# Patient Record
Sex: Male | Born: 1975 | Race: White | Hispanic: No | Marital: Single | Smoking: Never smoker
Health system: Southern US, Community
[De-identification: ages and names within clinical notes are randomized; demographics above are authoritative.]

---

## 2014-09-20 LAB — COMPREHENSIVE METABOLIC PANEL
ALT: 24 U/L
AST: 23 U/L
Albumin: 4.3 g/dL
Alkaline Phosphatase: 102 U/L
Anion Gap: 9 (ref 7–16)
BILIRUBIN TOTAL: 0.9 mg/dL
BUN: 12 mg/dL
CALCIUM: 9.3 mg/dL
CO2: 23 mmol/L
CREATININE: 1.04 mg/dL
Chloride: 107 mmol/L
EGFR (African American): >60
GLUCOSE: 133 mg/dL — AB
Potassium: 3.5 mmol/L
SODIUM: 139 mmol/L
Total Protein: 7.7 g/dL

## 2014-09-20 LAB — CBC
HCT: 44.2 % (ref 40.0–52.0)
HGB: 15.1 g/dL (ref 13.0–18.0)
MCH: 30.7 pg (ref 26.0–34.0)
MCHC: 34.2 g/dL (ref 32.0–36.0)
MCV: 90 fL (ref 80–100)
Platelet: 280 10*3/uL (ref 150–440)
RBC: 4.93 10*6/uL (ref 4.40–5.90)
RDW: 12.5 % (ref 11.5–14.5)
WBC: 11.2 10*3/uL — ABNORMAL HIGH (ref 3.8–10.6)

## 2014-09-20 LAB — ACETAMINOPHEN LEVEL

## 2014-09-20 LAB — SALICYLATE LEVEL

## 2014-09-20 LAB — ETHANOL: Ethanol: <5 mg/dL

## 2014-09-21 ENCOUNTER — Inpatient Hospital Stay
Admit: 2014-09-21 | Disposition: A | Discharge status: Home / Self Care | Payer: Self-pay | Attending: Psychiatry | Admitting: Psychiatry

## 2014-09-21 LAB — URINALYSIS, COMPLETE
BACTERIA: NONE SEEN
Bilirubin,UR: NEGATIVE
Blood: NEGATIVE
Glucose,UR: NEGATIVE mg/dL (ref 0–75)
Ketone: NEGATIVE
Leukocyte Esterase: NEGATIVE
NITRITE: NEGATIVE
Ph: 5 (ref 4.5–8.0)
Protein: NEGATIVE
SPECIFIC GRAVITY: 1.024 (ref 1.003–1.030)
Squamous Epithelial: NONE SEEN

## 2014-09-21 LAB — DRUG SCREEN, URINE
AMPHETAMINES, UR SCREEN: NEGATIVE
Barbiturates, Ur Screen: NEGATIVE
Benzodiazepine, Ur Scrn: NEGATIVE
COCAINE METABOLITE, UR ~~LOC~~: NEGATIVE
Cannabinoid 50 Ng, Ur ~~LOC~~: POSITIVE
MDMA (Ecstasy)Ur Screen: NEGATIVE
Methadone, Ur Screen: NEGATIVE
OPIATE, UR SCREEN: NEGATIVE
PHENCYCLIDINE (PCP) UR S: NEGATIVE
Tricyclic, Ur Screen: NEGATIVE

## 2014-09-22 LAB — TSH: THYROID STIMULATING HORM: 0.79 u[IU]/mL

## 2014-09-23 LAB — HEMOGLOBIN A1C: Hemoglobin A1C: 4.9 %

## 2014-10-11 NOTE — Consult Note (Signed)
Chief Complaint:  Chief Complaint Pt is a 39 yo obese male seen in ED BHU for follow up.   Presenting Symptoms:  Presenting Symptoms Anxiety/Panic  Mood Disturbance   History of Present Illness:  History of Present Illness He stated that he came here due to depression, mood swings and suicidal ideations. he was unable to keep his job and he was in therpay but it was not not effective. He was started on Celexa which made him worse and he became more agitated and angry. [facility] and they started him on Lamotrigine last night. He denied perceptual disturbances. has been using MJ occasioanlly to control his symptoms.   Target Symptoms:  Depressive Interest Loss  Appetite Change  Sleep Change  Anhedonia  Suicidality  Depressed Mood   Manic Impaired Judgment  Impulsivity   Arousal/Cognitive Decreased Concentration   Past Psychiatric Treatment: Current Psychotropic Medications: Celexa.  Mental Status Exam:  Mood Depressed   Affect Depressed   Thought Processes Linear/logical/goal directed   Orientation Place  Time  Person   Attention Alert   Concentration Fair   Memory Intact   Fund of Knowledge Good   Language Good   Judgement Good   Insight Good   Reliabiity Good   Review of Systems:  Review of Systems:  Medications/Allergies Reviewed Medications/Allergies reviewed   NURSING FLOWSHEETS:  Vital Signs/Nurse Notes-CM: ED Vital Sign Flow Sheet:   11-Apr-16 08:14  Temp Temperature 98.2  Temp Source oral  Pulse Pulse 83  Respirations Respirations 18  SBP SBP 134  DBP DBP 77  Pulse Ox % Pulse Ox % 98  Pulse Ox Source Source Room Air  Pain Scale (0-10) Pain Scale (0-10) Scale:0   Assessment & Diagnosis: Axis I: Bipolar Disorder.   Axis II: none.   Axis III: Obesity.  Treatment Plan: Patient is aware of and understands the risks and benefits of the proposed treatment or treatment changes..   Pt will be admitted to Inpt BHU Continue  Lamotrigine 25mg  po qdaily Treatment team to follow.  Electronic Signatures: Rhunette CroftFaheem, Xcaret Morad S (MD)  (Signed 11-Apr-16 12:13)  Authored: Chief Complaint, Presenting Symptoms, History of Present Illness, Target Symptoms, Past Psychiatric Treatment, Mental Status Exam, Review of Systems, NURSING FLOWSHEETS, Assessment & Diagnosis, Treatment Plan   Last Updated: 11-Apr-16 12:13 by Rhunette CroftFaheem, Konor Noren S (MD)

## 2014-10-11 NOTE — H&P (Signed)
PATIENT NAME:  Mason Craig, Mason Craig MR#:  [id number] DATE OF BIRTH:  07-15-1975  DATE OF ADMISSION:  09/21/2014  IDENTIFYING INFORMATION: The patient is a 39 year old obese single Caucasian male, currently unemployed, from [place], [place].   CHIEF COMPLAINT:  Decline in functioning and thoughts of self-harm.   HISTORY OF PRESENT ILLNESS: This patient presented to our Emergency Department on April 11 reporting having thoughts of wanting to hurt himself and worsening of his depression. The patient had a long history of depression that started in his adolescence, his depression worsened after his mother passed away unexpectedly 5 years ago. However the patient had not attended treatment for many years due to a bad experience with a therapist back in his adolescence. Just recently he started attending [facility] where he was prescribed with citalopram, the patient has been taking it daily for 30 days, but he reports lack of benefit. The patient said that for the last couple of weeks the depression has significantly worsened to the point where he was unable to attend work, he missed several days, and was eventually fired. The patient reports isolation, guilt, insomnia, poor appetite, poor energy, poor concentration, and episodes of agitation. The patient explains that he has been told by family members he has been agitated and at times displays threatening behavior. However he says "I don't realize doing it." Today the patient denies any thoughts of suicidality, homicidality, or auditory or visual hallucinations, however he does report thoughts of wanting to cut and the feeling that he was spiraling out of control. As far as bipolar disorder the patient having episodes that do not last more than 2 days when he has high energy, however this episode does not seem to meet full criteria for manic or hypomanic episode. Trauma, the patient denies any history of traumatic events. 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  Currently he uses marijuana once a month and he only smokes on special occasions. His last drink was in [place]. He drank heavily in his 64s, but once again now is a rare event. He does not smoke cigarettes.   PAST PSYCHIATRIC HISTORY: The patient denies any prior psychiatric hospitalizations or suicidal attempts. He does have history of self injury which was during his adolescent, his last episode of self injury was in Thanksgiving of last year. Currently on Celexa prescribed by Dr. Jacqualine Code from [facility] where he has been receiving treatment for the last 30 days. As an adolescent the patient was seen in therapy, however he felt betrayed as his therapist told all the information he had disclosed to his parents.   PAST MEDICAL HISTORY: The patient suffers from chronic tendinitis on his ankles and take ibuprofen as needed. 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 Denies any history of seizures or head trauma.   FAMILY HISTORY: The patient reported his father and sister suffer from depression and have been treated with antidepressants, but here is no history of suicide. He grandfather suffered from alcoholism.   SOCIAL HISTORY: The patient is currently living with his father. His mother passed away unexpectedly 5 years ago. The patient is single, never married, does not have any children. 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 As far as legal history, he denies any legal issues in the past or present. His education is 1-1/2 years of college.  ALLERGIES: HE IS ALLERGIC TO ONE NONSTEROIDAL ANTI-INFLAMMATORY DRUG, BUT DOES NOT REMEMBER THE NAME.   REVIEW OF SYSTEMS: The patient denies nausea, vomiting, or diarrhea. The rest of the review of systems is negative.   MENTAL STATUS  EXAMINATION: The patient is a 39 year old obese Caucasian male who appears his stated age. Appearance, obese, long hair, has a beard and black ink tattoos and piercing on his chin. Psychomotor activity mildly decreased. Eye contact within normal range. Speech had regular tone, volume, and rate. Thought process is linear and goal directed. Thought content negative for suicidality or homicidality. Perception negative for psychosis. Mood dysphoric. Affect congruent,  blunted.  Insight and judgment fair. On cognitive examination he is alert and oriented in person, place, time, and situation. Insight and judgment fair.    PHYSICAL EXAMINATION:  GENERAL: The patient is a 39 year old obese Caucasian male who appears his stated age.  VITAL SIGNS:  His height is 75 inches, weight is 370 pounds, BMI is 46.3. Blood pressure 142/86, respirations 20, pulse 66, temperature 98.  MUSCULOSKELETAL: Normal gait, normal muscular tone. No evidence of involuntary movements.   Physical examination was completed in our Emergency Department and was within normal limits.   LABORATORY RESULTS: BUN 12, creatinine 1.04, sodium 139, potassium 3.5, calcium 9.3. Alcohol was below detection limit. AST 23, ALT 24. Urine toxicology is positive for cannabis. WBC 12.2, hemoglobin 15.1, hematocrit 44.2, platelet count 280,000. UA is clear, clean. Acetaminophen level less than 5and  Salicylate level less than 4.   ASSESSMENT:  A 39 year old Caucasian male with history of depression who has worsened over the last several weeks and has failed to respond to treatment with citalopram. The patient felt inability to control his emotions and started having thoughts of self-harm, therefore was admitted in our facility for further stabilization.   DIAGNOSES:  1.  Major depressive disorder, recurrent, moderate.  2.  Marijuana use disorder, mild.  3.  Obesity.  4.  Chronic tendinitis.   5.  Pseudopapilledema.    PLAN:   1.  For major depressive  disorder the patient will be started on fluoxetine 20 mg p.o. daily.  2.  For insomnia I will restart the patient on trazodone 100 mg p.o. at bedtime.  3.  Laboratories, I will order a TSH and a B12 level in order to rule out organic causes for depression. In the Emergency Department the patient was evaluated by telemedicine, per their interview they felt that the patient met criteria for bipolar disorder and he was started on olanzapine and Lamictal, however based on my interview today the patient does not seem to meet criteria for bipolar disorder and I will discontinue those medications.  4.  Hospitalization status: The patient will be continued on voluntary status.  5.  Discharge disposition.  Once stable the patient will be discharged back to home and he will be scheduled to follow up with [facility].    ____________________________ Hildred Priest, MD ahg:bu D: 09/22/2014 17:35:09 ET T: 09/22/2014 18:06:47 ET JOB#: [id number]  cc: Hildred Priest, MD, <Dictator> Rhodia Albright MD ELECTRONICALLY SIGNED 09/23/2014 19:03

## 2019-09-01 ENCOUNTER — Emergency Department: Payer: BC Managed Care – PPO

## 2019-09-01 ENCOUNTER — Other Ambulatory Visit: Payer: Self-pay

## 2019-09-01 ENCOUNTER — Encounter: Payer: Self-pay | Admitting: Emergency Medicine

## 2019-09-01 ENCOUNTER — Emergency Department
Admission: EM | Admit: 2019-09-01 | Discharge: 2019-09-01 | Disposition: A | Discharge status: Home / Self Care | Payer: BC Managed Care – PPO | Attending: Emergency Medicine | Admitting: Emergency Medicine

## 2019-09-01 DIAGNOSIS — M10071 Idiopathic gout, right ankle and foot: Secondary | ICD-10-CM | POA: Diagnosis not present

## 2019-09-01 DIAGNOSIS — M79671 Pain in right foot: Secondary | ICD-10-CM | POA: Diagnosis present

## 2019-09-01 DIAGNOSIS — B070 Plantar wart: Secondary | ICD-10-CM | POA: Diagnosis not present

## 2019-09-01 LAB — BASIC METABOLIC PANEL
Anion gap: 8 (ref 5–15)
BUN: 13 mg/dL (ref 6–20)
CO2: 24 mmol/L (ref 22–32)
Calcium: 8.9 mg/dL (ref 8.9–10.3)
Chloride: 104 mmol/L (ref 98–111)
Creatinine, Ser: 0.78 mg/dL (ref 0.61–1.24)
GFR calc Af Amer: >60 mL/min (ref >60)
GFR calc non Af Amer: >60 mL/min (ref >60)
Glucose, Bld: 114 mg/dL — ABNORMAL HIGH (ref 70–99)
Potassium: 4.1 mmol/L (ref 3.5–5.1)
Sodium: 136 mmol/L (ref 135–145)

## 2019-09-01 LAB — CBC WITH DIFFERENTIAL/PLATELET
Abs Immature Granulocytes: 0.03 10*3/uL (ref 0.00–0.07)
Basophils Absolute: 0 10*3/uL (ref 0.0–0.1)
Basophils Relative: 0 %
Eosinophils Absolute: 0.2 10*3/uL (ref 0.0–0.5)
Eosinophils Relative: 3 %
HCT: 41 % (ref 39.0–52.0)
Hemoglobin: 13.9 g/dL (ref 13.0–17.0)
Immature Granulocytes: 0 %
Lymphocytes Relative: 19 %
Lymphs Abs: 1.4 10*3/uL (ref 0.7–4.0)
MCH: 31 pg (ref 26.0–34.0)
MCHC: 33.9 g/dL (ref 30.0–36.0)
MCV: 91.3 fL (ref 80.0–100.0)
Monocytes Absolute: 0.6 10*3/uL (ref 0.1–1.0)
Monocytes Relative: 8 %
Neutro Abs: 5 10*3/uL (ref 1.7–7.7)
Neutrophils Relative %: 70 %
Platelets: 299 10*3/uL (ref 150–400)
RBC: 4.49 MIL/uL (ref 4.22–5.81)
RDW: 11.5 % (ref 11.5–15.5)
WBC: 7.2 10*3/uL (ref 4.0–10.5)
nRBC: 0 % (ref 0.0–0.2)

## 2019-09-01 LAB — URIC ACID: Uric Acid, Serum: 7.9 mg/dL (ref 3.7–8.6)

## 2019-09-01 LAB — SEDIMENTATION RATE: Sed Rate: 46 mm/hr — ABNORMAL HIGH (ref 0–15)

## 2019-09-01 MED ORDER — TRAMADOL HCL 50 MG PO TABS
50.0000 mg | ORAL_TABLET | Freq: Once | ORAL | Status: AC
  Administered 2019-09-01: 50 mg via ORAL
  Filled 2019-09-01: qty 1

## 2019-09-01 MED ORDER — COLCHICINE 0.6 MG PO TABS
1.2000 mg | ORAL_TABLET | Freq: Once | ORAL | Status: AC
  Administered 2019-09-01: 1.2 mg via ORAL
  Filled 2019-09-01: qty 2

## 2019-09-01 MED ORDER — COLCHICINE 0.6 MG PO TABS: 0.6000 mg | ORAL_TABLET | Freq: Every day | ORAL | 2 refills | Status: AC

## 2019-09-01 MED ORDER — OXYCODONE-ACETAMINOPHEN 7.5-325 MG PO TABS: 1.0000 | ORAL_TABLET | Freq: Four times a day (QID) | ORAL | 0 refills | Status: AC | PRN

## 2019-09-01 MED ORDER — KETOROLAC TROMETHAMINE 30 MG/ML IJ SOLN
30.0000 mg | Freq: Once | INTRAMUSCULAR | Status: AC
  Administered 2019-09-01: 30 mg via INTRAVENOUS
  Filled 2019-09-01: qty 1

## 2019-09-01 MED ORDER — INDOMETHACIN 50 MG PO CAPS
50.0000 mg | ORAL_CAPSULE | Freq: Three times a day (TID) | ORAL | 1 refills | Status: AC | PRN
Start: 2019-09-01 — End: ?

## 2019-09-01 NOTE — ED Provider Notes (Signed)
[facility] Emergency Department Provider Note   ____________________________________________   First MD Initiated Contact with Patient 09/01/19 1145     (approximate)  [name].   HISTORY  Chief Complaint Foot Pain    HPI Mason Craig is a 44 y.o. male patient presents with right foot pain for few days.  Patient states no provocative incident for complaint.  Patient described the pain as "throbbing".  Patient unable to bear weight secondary to discomfort.  Rates the pain as 4/10.  No palliative measure for complaint.  Patient is morbid obese.         History reviewed. No pertinent past medical history.  [name]   History reviewed. No pertinent surgical history.  Prior to Admission medications   Medication Sig Start Date End Date Taking? Authorizing Provider  colchicine 0.6 MG tablet Take 1 tablet (0.6 mg total) by mouth daily. 09/01/19 08/31/20  Joni Reining, PA-C  indomethacin (INDOCIN) 50 MG capsule Take 1 capsule (50 mg total) by mouth 3 (three) times daily as needed. 09/01/19   Joni Reining, PA-C  oxyCODONE-acetaminophen (PERCOCET) 7.5-325 MG tablet Take 1 tablet by mouth every 6 (six) hours as needed. 09/01/19   Joni Reining, PA-C    Allergies Patient has no known allergies.  No family history on file.  Social History Social History   Tobacco Use  . Smoking status: Never Smoker  . Smokeless tobacco: Never Used  Substance Use Topics  . Alcohol use: Not Currently  . Drug use: Not on file    Review of Systems Constitutional: No fever/chills Eyes: No visual changes. ENT: No sore throat. Cardiovascular: Denies chest pain. Respiratory: Denies shortness of breath. Gastrointestinal: No abdominal pain.  No nausea, no vomiting.  No diarrhea.  No constipation. Genitourinary: Negative for dysuria. Musculoskeletal: Right foot pain.  Skin: Negative for rash. Neurological: Negative for headaches, focal weakness or numbness.   ____________________________________________   PHYSICAL EXAM:  VITAL SIGNS: [name]  [name]     BP 09/01/19 1127 140/89     Pulse Rate 09/01/19 1127 75     Resp 09/01/19 1127 18     Temp 09/01/19 1127 97.7 F (36.5 C)     Temp Source 09/01/19 1127 Oral     SpO2 09/01/19 1127 99 %     Weight 09/01/19 1131 (!) 449 lb 11.8 oz (204 kg)     Height 09/01/19 1127 6\' 3"  (1.905 m)     Head Circumference --      Peak Flow --      Pain Score 09/01/19 1127 4     Pain Loc --      Pain Edu? --      Excl. in GC? --     Constitutional: Alert and oriented. Well appearing and in no acute distress.  Morbid obesity. Cardiovascular: Normal rate, regular rhythm. Grossly normal heart sounds.  Good peripheral circulation. Respiratory: Normal respiratory effort.  No retractions. Lungs CTAB. Musculoskeletal: Pes planus obvious edema but no erythema..  Neurologic:  Normal speech and language. No gross focal neurologic deficits are appreciated. No gait instability. Skin:  Skin is warm, dry and intact. No rash noted.  Multiple calluses secondary to plantar warts. Psychiatric: Mood and affect are normal. Speech and behavior are normal.  ____________________________________________   LABS (all labs ordered are listed, but only abnormal results are displayed)  Labs Reviewed  BASIC METABOLIC PANEL - Abnormal; Notable for the following components:      Result Value   Glucose, Bld 114 (*)    All other components within normal limits  SEDIMENTATION RATE - Abnormal; Notable for the following components:   Sed Rate 46 (*)    All other components within normal limits  CBC WITH DIFFERENTIAL/PLATELET  URIC ACID   ____________________________________________  EKG   ____________________________________________  RADIOLOGY  [name]:    Official radiology report(s): DG Foot  Complete Right  Result Date: 09/01/2019 CLINICAL DATA:  Foot pain and swelling, no known injury, initial encounter EXAM: RIGHT FOOT COMPLETE - 3+ VIEW COMPARISON:  None. FINDINGS: Generalized soft tissue swelling is noted. No radiopaque foreign body is seen. No acute fracture is noted. Tarsal degenerative changes are seen. No other focal abnormality is noted. IMPRESSION: Soft tissue swelling without acute bony abnormality. Electronically Signed   By: Inez Catalina M.D.   On: 09/01/2019 12:22    ____________________________________________   PROCEDURES  Procedure(s) performed (including Critical Care):  Procedures   ____________________________________________   [name]  [name], [name]   Patient presents with nontraumatic foot pain and edema.  Gust lab results with patient consistent with inflammatory process.  Patient physical exam is consistent with gout.  Patient given discharge care instruction advised take medication as directed.  Patient advised to follow-up with podiatry due to multiple plantar warts with heavy calluses.       Mason Craig was evaluated in Emergency Department on 09/01/2019 for the symptoms described in the history of present illness. He was evaluated in the context of the global COVID-19 pandemic, which necessitated consideration that the patient might be at risk for infection with the SARS-CoV-2 virus that causes COVID-19. Institutional protocols and algorithms that pertain to the evaluation of patients at risk for COVID-19 are in a state of rapid change based on information released by regulatory bodies including the CDC and federal and state organizations. These policies and algorithms were followed during the patient's care in the ED.       ____________________________________________   FINAL CLINICAL IMPRESSION(S) /  ED DIAGNOSES  Final diagnoses:  Acute idiopathic gout of right foot  Plantar warts     ED Discharge Orders         Ordered    colchicine 0.6 MG tablet  Daily     09/01/19 1345    indomethacin (INDOCIN) 50 MG capsule  3 times daily PRN     09/01/19 1345    oxyCODONE-acetaminophen (PERCOCET) 7.5-325 MG tablet  Every 6 hours PRN     09/01/19 1345           Note:  This document was prepared using Dragon voice recognition software and may include unintentional dictation errors.    Sable Feil, PA-C 09/01/19 1349    Blake Divine, MD 09/02/19 2023

## 2019-09-01 NOTE — ED Triage Notes (Signed)
Pt here with c/o right foot pain that began a few days ago, "throbbing" while in triage, denies known injury to foot, is able to put minimal pressure on it. Nad.

## 2019-09-01 NOTE — ED Notes (Signed)
Gave walker to pt and wheeled out to parking lot for ride.

## 2019-09-01 NOTE — Discharge Instructions (Signed)
Follow discharge care instruction take medication as directed.  Ambulate with crutches for 3 to 5 days as needed.  Advised to follow-up podiatry due to your foot callus and plantar warts.

## 2019-11-09 ENCOUNTER — Other Ambulatory Visit: Payer: Self-pay

## 2019-11-09 ENCOUNTER — Emergency Department
Admission: EM | Admit: 2019-11-09 | Discharge: 2019-11-09 | Disposition: A | Discharge status: Home / Self Care | Payer: Self-pay | Attending: Emergency Medicine | Admitting: Emergency Medicine

## 2019-11-09 ENCOUNTER — Emergency Department: Payer: Self-pay

## 2019-11-09 ENCOUNTER — Encounter: Payer: Self-pay | Admitting: Emergency Medicine

## 2019-11-09 DIAGNOSIS — M25522 Pain in left elbow: Secondary | ICD-10-CM | POA: Insufficient documentation

## 2019-11-09 DIAGNOSIS — Z79899 Other long term (current) drug therapy: Secondary | ICD-10-CM | POA: Insufficient documentation

## 2019-11-09 MED ORDER — OXYCODONE-ACETAMINOPHEN 5-325 MG PO TABS
1.0000 | ORAL_TABLET | Freq: Once | ORAL | Status: AC
  Administered 2019-11-09: 1 via ORAL
  Filled 2019-11-09: qty 1

## 2019-11-09 MED ORDER — TRAMADOL HCL 50 MG PO TABS: 50.0000 mg | ORAL_TABLET | Freq: Four times a day (QID) | ORAL | 0 refills | Status: AC | PRN

## 2019-11-09 MED ORDER — PREDNISONE 50 MG PO TABS: ORAL_TABLET | ORAL | 0 refills | Status: AC

## 2019-11-09 NOTE — ED Triage Notes (Signed)
Pt arrived via POV from home with c/o left elbow pain worse since yesterday. Denies any injury, c/o pain to the touch and states when he rotates his wrist the pain is worse.  Pt states last night he took tylenol and ibuprofen with no relief.

## 2019-11-09 NOTE — ED Provider Notes (Signed)
[facility] Emergency Department Provider Note  ____________________________________________  Time seen: Approximately 1:04 PM  [name].   HISTORY  Chief Complaint Elbow Pain    HPI Mason Craig is a 44 y.o. male that presents to the emergency department for evaluation of left elbow pain for 2 days.  Patient states the pain is to the back of his elbow.  He is able to bend his elbow but with pain.  He does recall hitting his elbow on something several days ago but did not immediately have pain.  He denies any pain to the outside or inside of his elbow.  He does have a history of gout but usually has this in his toe.  He also has tendinitis in his ankle.  No history of diabetes.  No wounds.  No IV drug use.  No fevers.   History reviewed. No pertinent past medical history.  [name]   History reviewed. No pertinent surgical history.  Prior to Admission medications   Medication Sig Start Date End Date Taking? Authorizing Provider  colchicine 0.6 MG tablet Take 1 tablet (0.6 mg total) by mouth daily. 09/01/19 08/31/20  Joni Reining, PA-C  indomethacin (INDOCIN) 50 MG capsule Take 1 capsule (50 mg total) by mouth 3 (three) times daily as needed. 09/01/19   Joni Reining, PA-C  oxyCODONE-acetaminophen (PERCOCET) 7.5-325 MG tablet Take 1 tablet by mouth every 6 (six) hours as needed. 09/01/19   Joni Reining, PA-C  predniSONE (DELTASONE) 50 MG tablet Take 1 tablet per day 11/09/19   Enid Derry, PA-C  traMADol (ULTRAM) 50 MG tablet Take 1 tablet (50 mg total) by mouth every 6 (six) hours as needed. 11/09/19 11/08/20  Enid Derry, PA-C    Allergies Diclofenac  No family history on file.  Social History Social History   Tobacco Use  . Smoking status: Never Smoker  . Smokeless tobacco: Never Used  Substance Use Topics  . Alcohol use: Not Currently  . Drug  use: Not on file     Review of Systems  Cardiovascular: No chest pain. Respiratory: No SOB. Gastrointestinal:  No nausea, no vomiting.  Musculoskeletal: Positive for elbow pain. Skin: Negative for rash, abrasions, lacerations, ecchymosis. Neurological: Negative for numbness or tingling   ____________________________________________   PHYSICAL EXAM:  VITAL SIGNS: [name]  [name]     BP 11/09/19 1041 (!) 119/101     Pulse Rate 11/09/19 1041 85     Resp 11/09/19 1041 (!) 22     Temp 11/09/19 1041 98.1 F (36.7 C)     Temp Source 11/09/19 1041 Oral     SpO2 11/09/19 1041 95 %     Weight 11/09/19 1039 (!) 460 lb (208.7 kg)     Height 11/09/19 1039 6\' 3"  (1.905 m)     Head Circumference --      Peak Flow --      Pain Score 11/09/19 1039 7     Pain Loc --      Pain Edu? --      Excl. in GC? --      Constitutional: Alert and oriented. Well appearing and in no acute distress. Eyes: Conjunctivae are normal. PERRL. EOMI. Head: Atraumatic. ENT:      Ears:      Nose: No congestion/rhinnorhea.      Mouth/Throat: Mucous membranes are moist.  Neck: No  stridor.   Cardiovascular: Normal rate, regular rhythm.  Good peripheral circulation. Respiratory: Normal respiratory effort without tachypnea or retractions. Lungs CTAB. Good air entry to the bases with no decreased or absent breath sounds. Musculoskeletal: Full range of motion to all extremities. No gross deformities appreciated.  Tenderness to palpation to left posterior elbow.  Full range of motion of elbow with pain.  No overlying erythema.  No wounds. Neurologic:  Normal speech and language. No gross focal neurologic deficits are appreciated.  Skin:  Skin is warm, dry and intact. No rash noted. Psychiatric: Mood and affect are normal. Speech and behavior are normal. Patient exhibits appropriate insight and judgement.   ____________________________________________   LABS (all labs ordered are listed, but  only abnormal results are displayed)  Labs Reviewed - No data to display ____________________________________________  EKG   ____________________________________________  RADIOLOGY Robinette Haines, personally viewed and evaluated these images (plain radiographs) as part of my medical decision making, as well as reviewing the written report by the radiologist.  DG Elbow Complete Left  Result Date: 11/09/2019 CLINICAL DATA:  Pain EXAM: LEFT ELBOW - COMPLETE 3+ VIEW COMPARISON:  None. FINDINGS: Frontal, lateral, and bilateral oblique views were obtained. No evident fracture or dislocation. No joint effusion. Joint spaces appear normal. No erosive change. IMPRESSION: No fracture or dislocation.  No evident arthropathy. Electronically Signed   By: Lowella Grip III M.D.   On: 11/09/2019 11:56    ____________________________________________    PROCEDURES  Procedure(s) performed:    Procedures    Medications  oxyCODONE-acetaminophen (PERCOCET/ROXICET) 5-325 MG per tablet 1 tablet (1 tablet Oral Given 11/09/19 1256)     ____________________________________________   [name]  Pertinent labs & imaging results that were available during my care of the patient were reviewed by me and considered in my medical decision making (see chart for details).  Review of the [place] CSRS was performed in accordance of the [place] prior to dispensing any controlled drugs.     Patient presented to the emergency department for evaluation of elbow pain.  Vital signs and exam are reassuring.  No indication of bacterial infection.  Patient denies any specific trauma.  Patient will be treated with steroids for inflammation and possible gout.  Patient will be discharged home with prescriptions for prednisone and tramadol.  Patient is to follow up with primary care as directed. Patient is given ED precautions to return to the ED for any worsening or new  symptoms.  M[facility] Craig was evaluated in Emergency Department on 11/09/2019 for the symptoms described in the history of present illness. He was evaluated in the context of the global COVID-19 pandemic, which necessitated consideration that the patient might be at risk for infection with the SARS-CoV-2 virus that causes COVID-19. Institutional protocols and algorithms that pertain to the evaluation of patients at risk for COVID-19 are in a state of rapid change based on information released by regulatory bodies including the CDC and federal and state organizations. These policies and algorithms were followed during the patient's care in the ED.   ____________________________________________  FINAL CLINICAL IMPRESSION(S) / ED DIAGNOSES  Final diagnoses:  Left elbow pain      NEW MEDICATIONS STARTED DURING THIS VISIT:  ED Discharge Orders         Ordered    traMADol (ULTRAM) 50 MG tablet  Every 6 hours PRN     11/09/19 1308    predniSONE (DELTASONE) 50 MG tablet  11/09/19 1308              This chart was dictated using voice recognition software/Dragon. Despite best efforts to proofread, errors can occur which can change the meaning. Any change was purely unintentional.    Enid Derry, PA-C 11/09/19 1450    Phineas Semen, MD 11/09/19 [phone]

## 2020-12-28 IMAGING — DX DG ELBOW COMPLETE 3+V*L*
4 series · 4 of 4 positions shown · non-contrast
Comparison: None.

CLINICAL DATA: Pain

EXAM:
LEFT ELBOW - COMPLETE 3+ VIEW

[elbow ap]
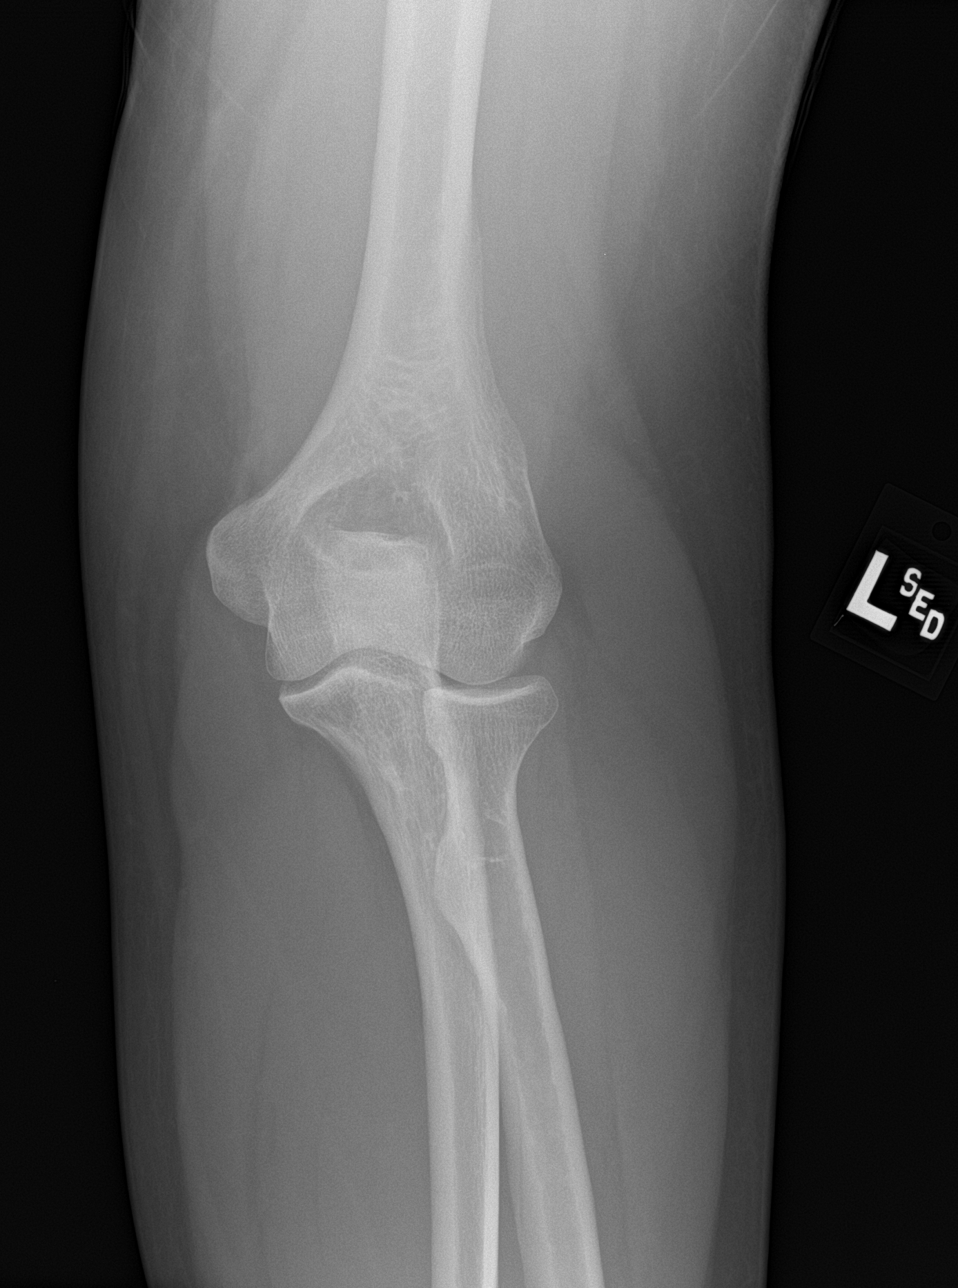

[elbow obl (1 of 2)]
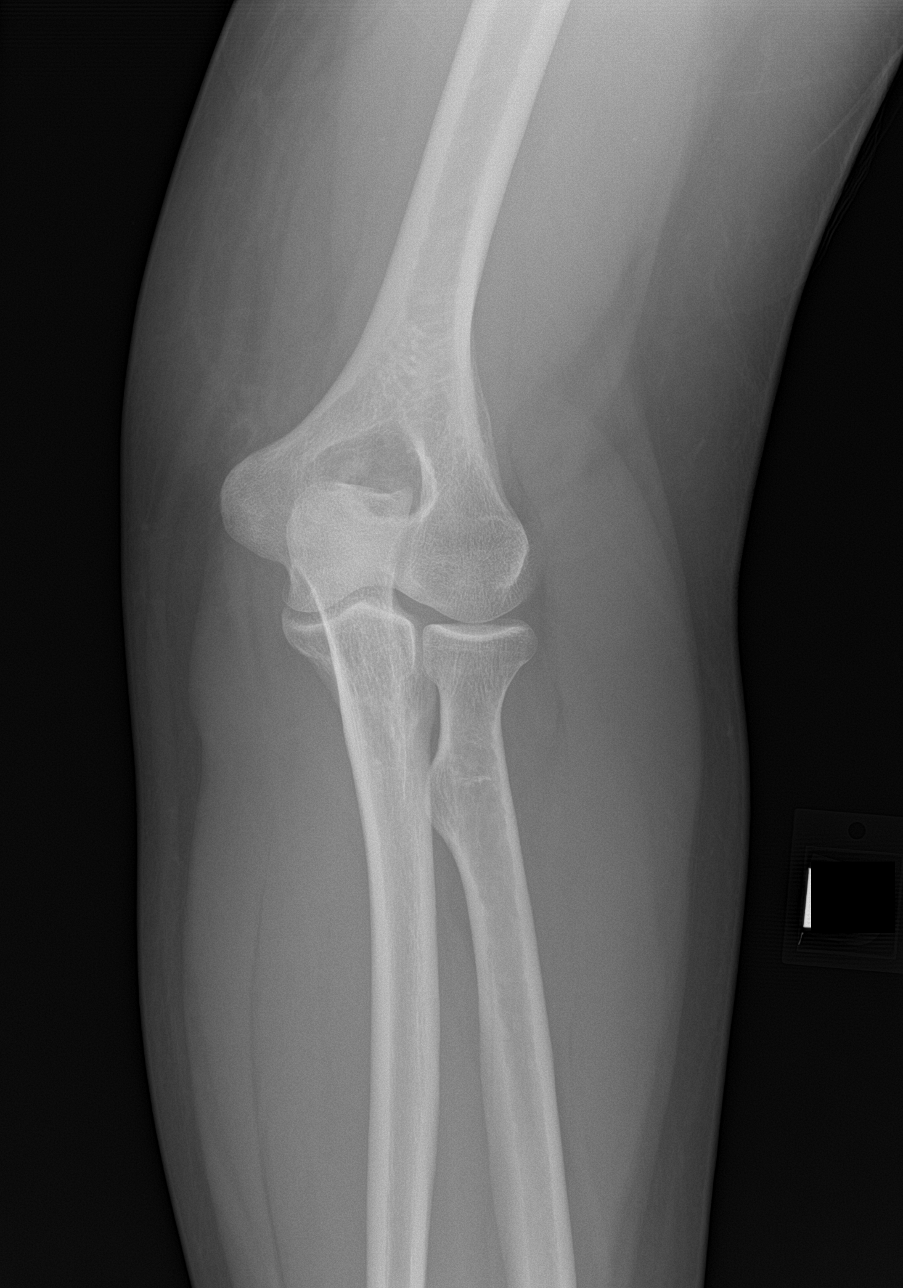

[elbow obl (2 of 2)]
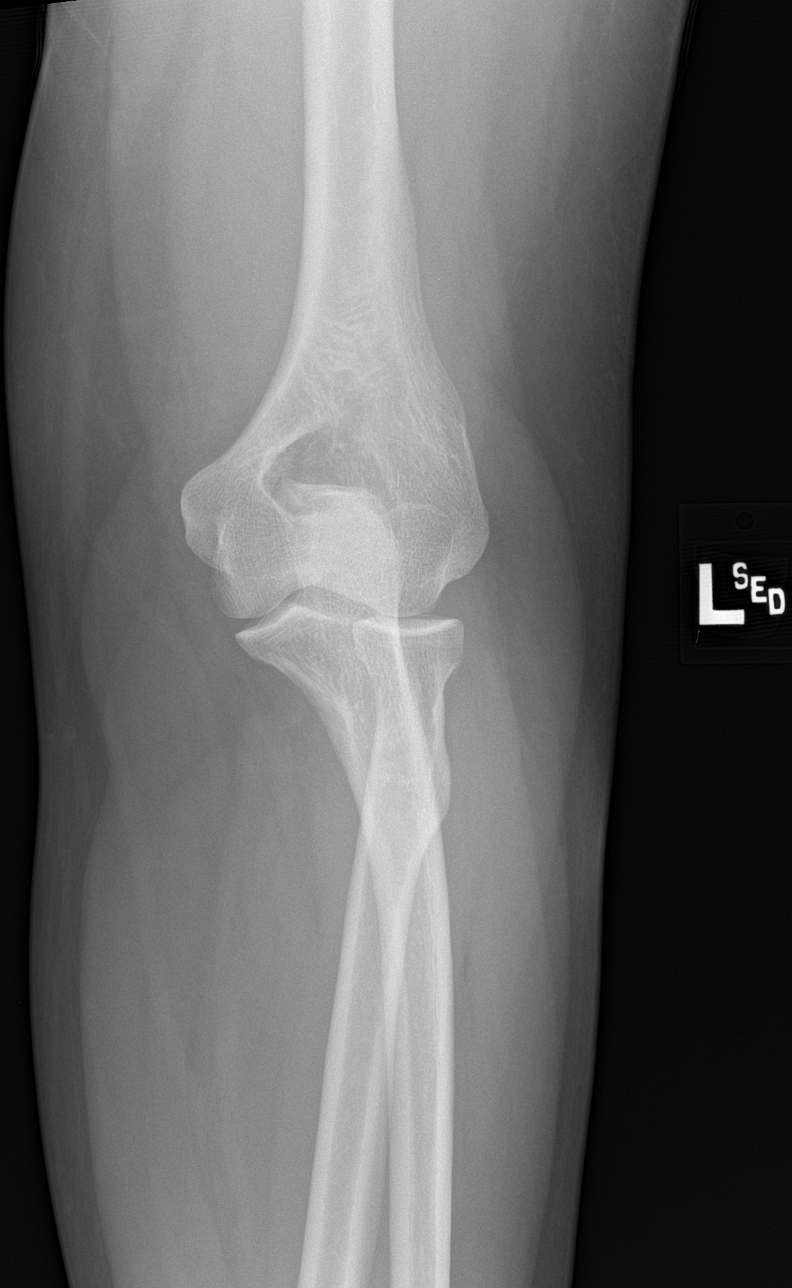

[elbow lat]
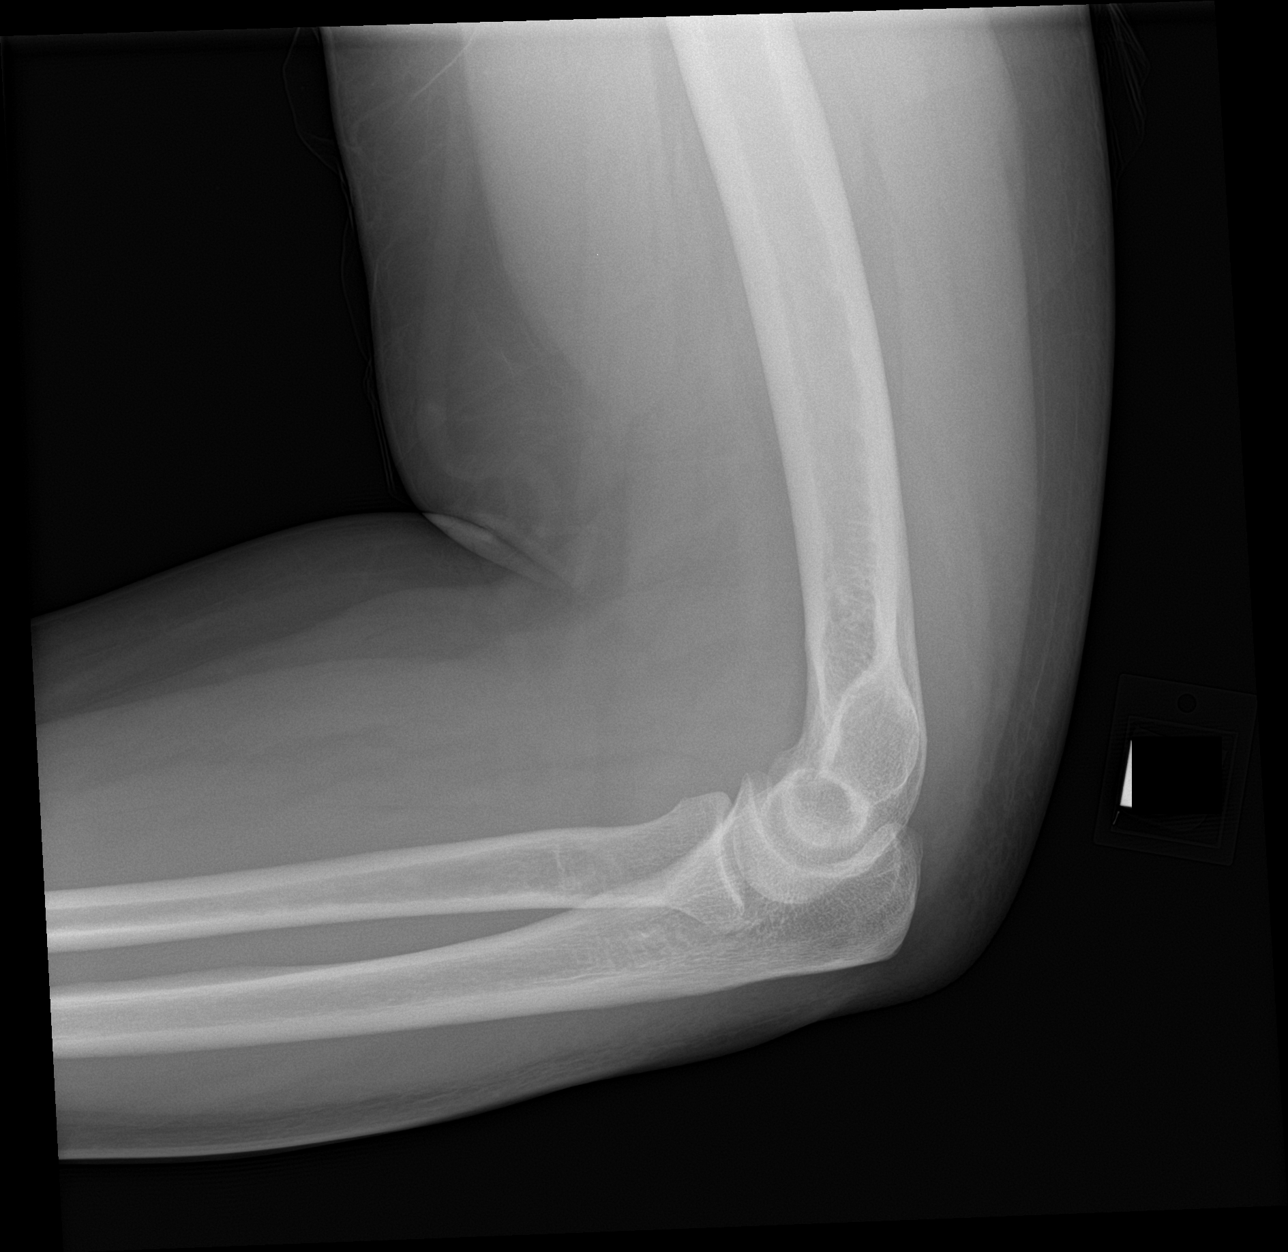

[4 of 4 positions shown; findings below may reference images not displayed]

FINDINGS: Frontal, lateral, and bilateral oblique views were obtained. No
evident fracture or dislocation. No joint effusion. Joint spaces
appear normal. No erosive change.
IMPRESSION: No fracture or dislocation.  No evident arthropathy.
